# Patient Record
Sex: Male | Born: 1940 | Race: White | Hispanic: No | Marital: Married | Smoking: Never smoker
Health system: Southern US, Community
[De-identification: ages and names within clinical notes are randomized; demographics above are authoritative.]

## PROBLEM LIST (undated history)

## (undated) DIAGNOSIS — M810 Age-related osteoporosis without current pathological fracture: Secondary | ICD-10-CM

## (undated) DIAGNOSIS — H269 Unspecified cataract: Secondary | ICD-10-CM

## (undated) HISTORY — DX: Age-related osteoporosis without current pathological fracture: M81.0

## (undated) HISTORY — PX: BELPHAROPTOSIS REPAIR: SHX369

## (undated) HISTORY — DX: Unspecified cataract: H26.9

## (undated) HISTORY — PX: MANDIBLE SURGERY: SHX707

---
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
## 1998-11-20 HISTORY — PX: HIP FRACTURE SURGERY: SHX118

## 1999-04-09 ENCOUNTER — Encounter: Payer: Self-pay | Admitting: Emergency Medicine

## 1999-04-10 ENCOUNTER — Encounter: Payer: Self-pay | Admitting: Orthopedic Surgery

## 1999-04-10 ENCOUNTER — Inpatient Hospital Stay (HOSPITAL_COMMUNITY): Admission: EM | Admit: 1999-04-10 | Discharge: 1999-04-15 | Payer: Self-pay | Admitting: Emergency Medicine

## 1999-04-15 ENCOUNTER — Inpatient Hospital Stay (HOSPITAL_COMMUNITY)
Admission: RE | Admit: 1999-04-15 | Discharge: 1999-04-22 | Payer: Self-pay | Admitting: Physical Medicine and Rehabilitation

## 2006-06-05 ENCOUNTER — Encounter: Admission: RE | Admit: 2006-06-05 | Discharge: 2006-06-05 | Payer: Self-pay | Admitting: Emergency Medicine

## 2009-11-20 HISTORY — PX: HEMORRHOID SURGERY: SHX153

## 2010-04-14 ENCOUNTER — Ambulatory Visit (HOSPITAL_COMMUNITY): Admission: RE | Admit: 2010-04-14 | Discharge: 2010-04-14 | Payer: Self-pay | Admitting: General Surgery

## 2011-02-06 LAB — CBC
HCT: 46.2 % (ref 39.0–52.0)
MCV: 91.2 fL (ref 78.0–100.0)
Platelets: 194 10*3/uL (ref 150–400)
RBC: 5.06 MIL/uL (ref 4.22–5.81)
WBC: 6.3 10*3/uL (ref 4.0–10.5)

## 2011-02-06 LAB — BASIC METABOLIC PANEL
BUN: 18 mg/dL (ref 6–23)
CO2: 32 mEq/L (ref 19–32)
Calcium: 9.6 mg/dL (ref 8.4–10.5)
Chloride: 104 mEq/L (ref 96–112)
Creatinine, Ser: 1.11 mg/dL (ref 0.4–1.5)
GFR calc Af Amer: >60 mL/min (ref >60)
GFR calc non Af Amer: >60 mL/min (ref >60)
Glucose, Bld: 137 mg/dL — ABNORMAL HIGH (ref 70–99)
Potassium: 5.4 mEq/L — ABNORMAL HIGH (ref 3.5–5.1)
Sodium: 140 mEq/L (ref 135–145)

## 2011-02-06 LAB — DIFFERENTIAL
Eosinophils Absolute: 0 10*3/uL (ref 0.0–0.7)
Eosinophils Relative: 1 % (ref 0–5)
Lymphs Abs: 2.4 10*3/uL (ref 0.7–4.0)
Monocytes Relative: 6 % (ref 3–12)

## 2012-11-07 ENCOUNTER — Other Ambulatory Visit: Payer: Self-pay | Admitting: Family Medicine

## 2012-11-07 ENCOUNTER — Ambulatory Visit
Admission: RE | Admit: 2012-11-07 | Discharge: 2012-11-07 | Disposition: A | Discharge status: Home / Self Care | Payer: BC Managed Care – PPO | Source: Ambulatory Visit | Attending: Family Medicine | Admitting: Family Medicine

## 2012-11-07 DIAGNOSIS — M79602 Pain in left arm: Secondary | ICD-10-CM

## 2014-02-01 IMAGING — CR DG ELBOW COMPLETE 3+V*L*
4 series · 4 of 4 positions shown · non-contrast
Comparison: None

CLINICAL DATA: Fell.  Left elbow pain.

LEFT ELBOW - COMPLETE 3+ VIEW

[view not recorded (1 of 4)]
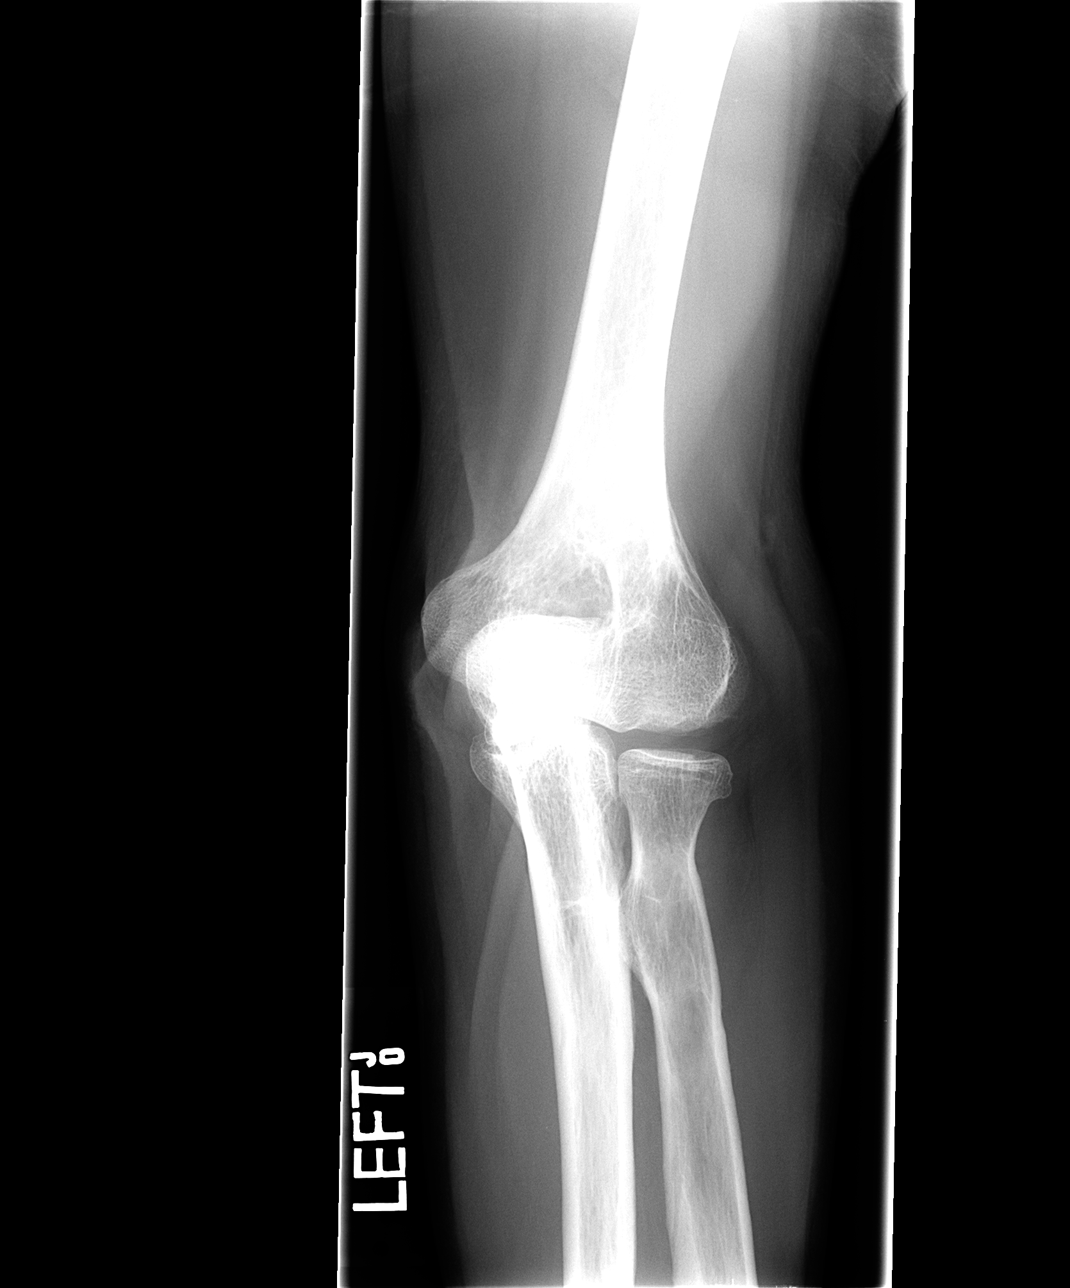

[view not recorded (2 of 4)]
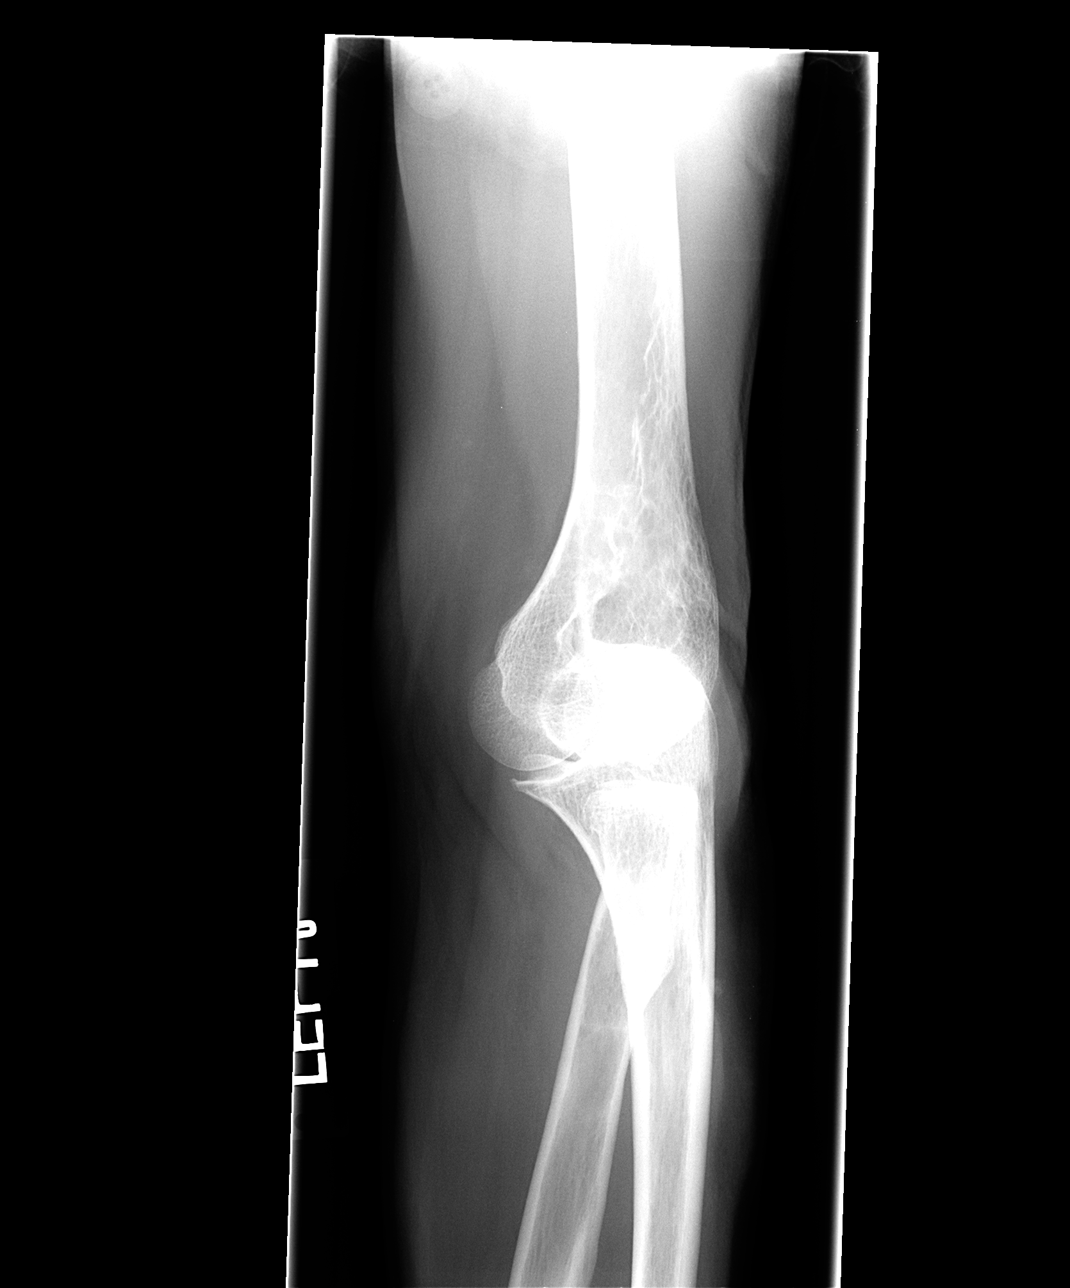

[view not recorded (3 of 4)]
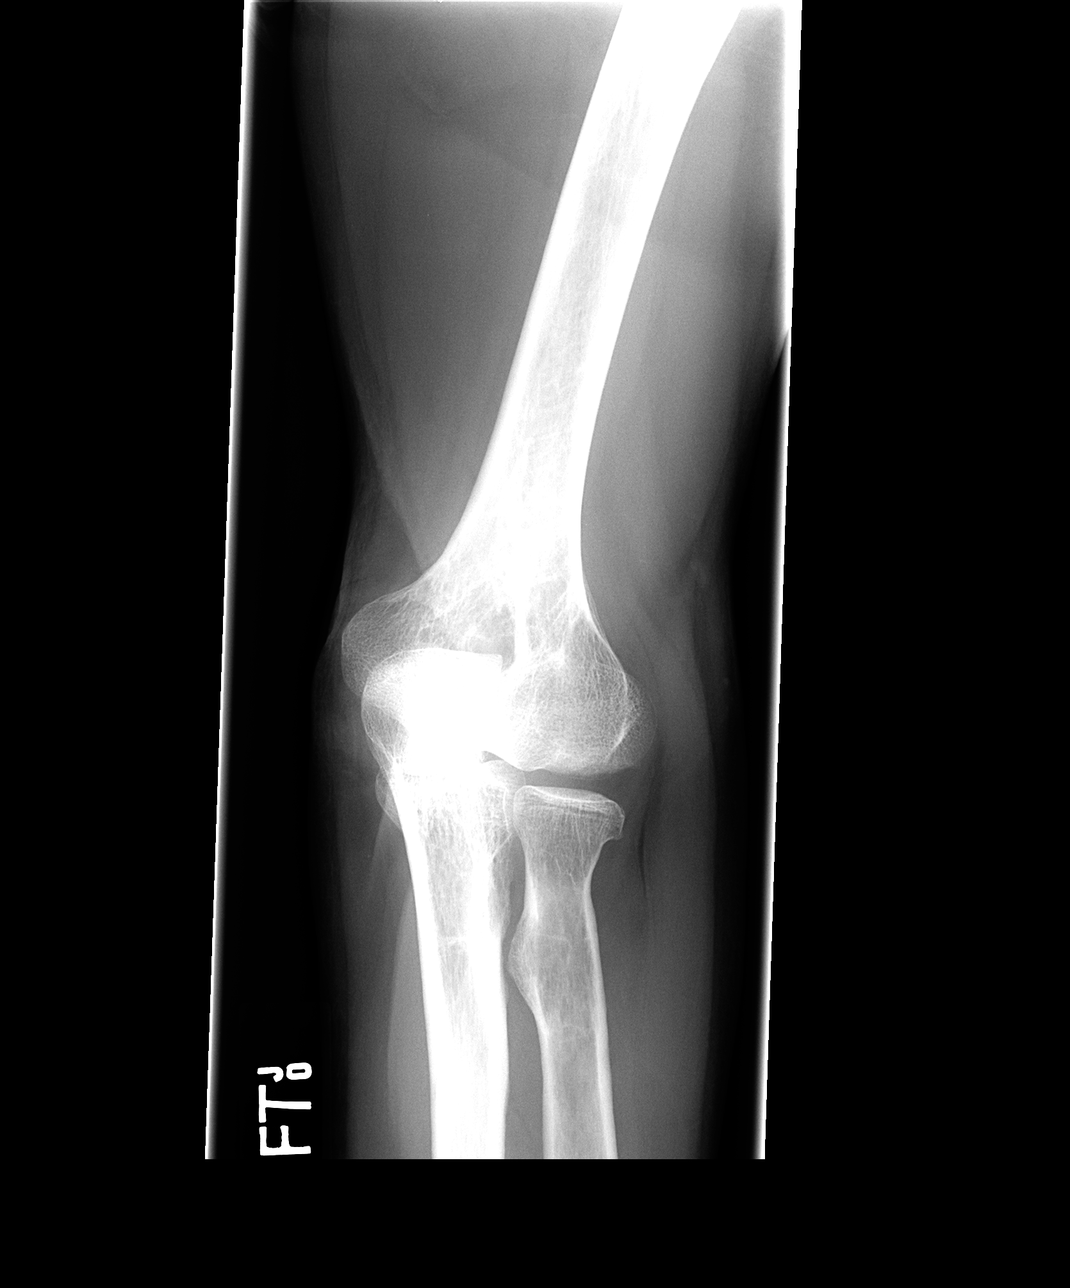

[view not recorded (4 of 4)]
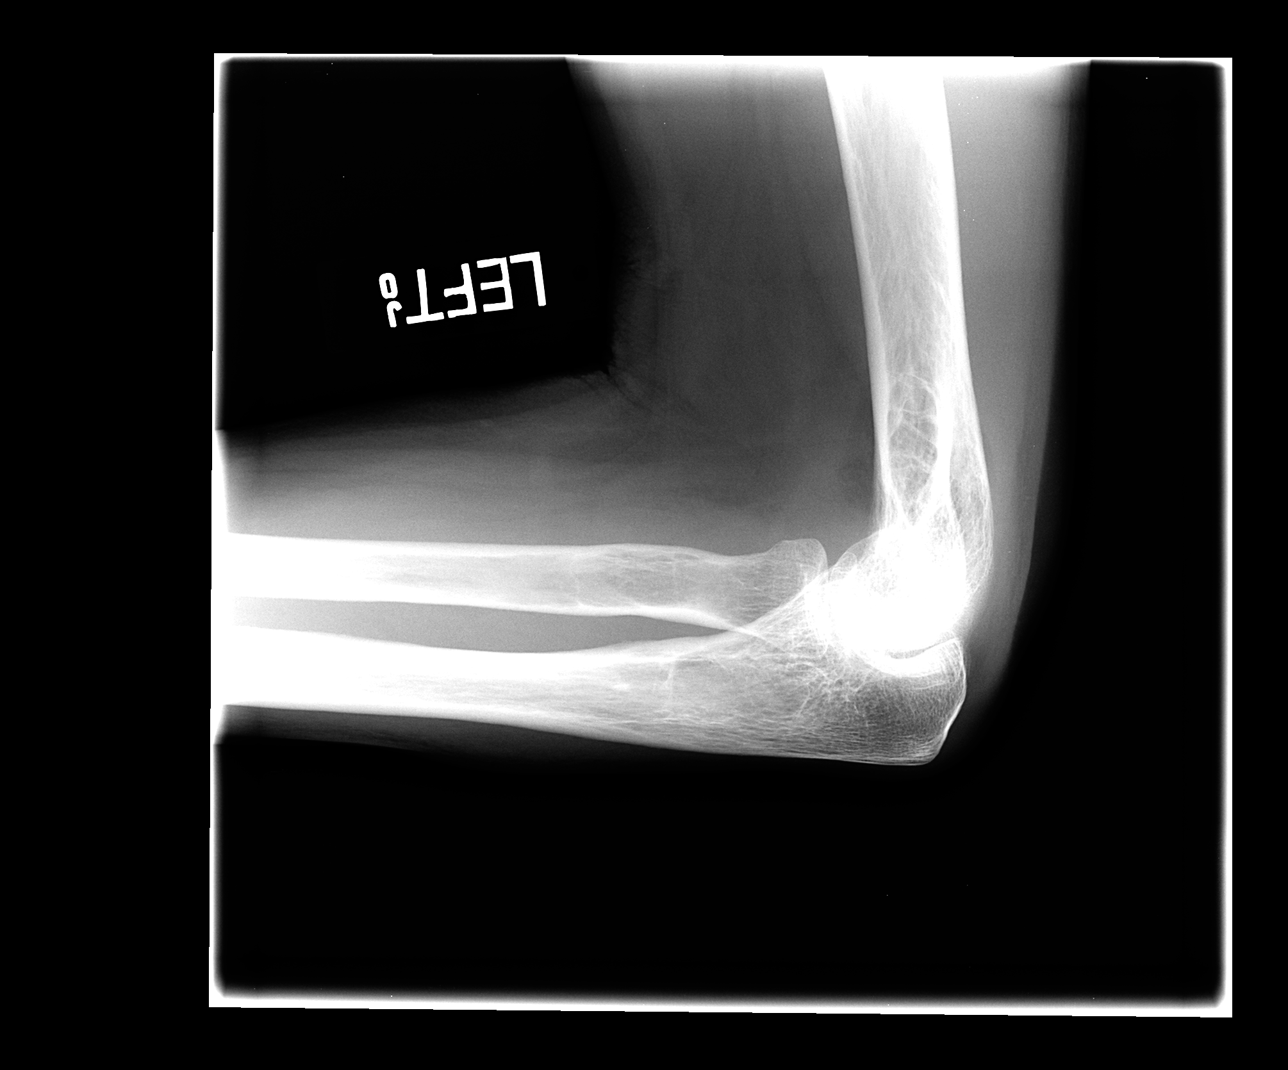

[4 of 4 positions shown; findings below may reference images not displayed]

FINDINGS: There is an elbow joint effusion and a nondisplaced
radial head fracture.
IMPRESSION: Nondisplaced radial head fracture and associated elbow joint
effusion.

## 2014-03-13 ENCOUNTER — Encounter: Payer: Self-pay | Admitting: Internal Medicine

## 2014-07-14 ENCOUNTER — Encounter: Payer: Self-pay | Admitting: Internal Medicine

## 2014-09-02 ENCOUNTER — Ambulatory Visit (AMBULATORY_SURGERY_CENTER): Payer: Medicare PPO | Admitting: *Deleted

## 2014-09-02 VITALS — Ht 74.0 in | Wt 190.0 lb

## 2014-09-02 DIAGNOSIS — Z1211 Encounter for screening for malignant neoplasm of colon: Secondary | ICD-10-CM

## 2014-09-02 MED ORDER — MOVIPREP 100 G PO SOLR: ORAL | Status: DC

## 2014-09-02 NOTE — Progress Notes (Signed)
Patient denies any allergies to eggs or soy. Patient denies any problems with anesthesia/sedation. Patient denies any oxygen use at home and does not take any diet/weight loss medications. EMMI education assisgned to patient on colonoscopy, this was explained and instructions given to patient. 

## 2014-09-14 ENCOUNTER — Telehealth: Payer: Self-pay | Admitting: Internal Medicine

## 2014-09-14 ENCOUNTER — Encounter: Payer: Self-pay | Admitting: Internal Medicine

## 2014-09-14 NOTE — Telephone Encounter (Signed)
Noted. Added to pt's history. Thanks

## 2014-09-16 ENCOUNTER — Ambulatory Visit (AMBULATORY_SURGERY_CENTER): Payer: Medicare PPO | Admitting: Internal Medicine

## 2014-09-16 ENCOUNTER — Encounter: Payer: Self-pay | Admitting: Internal Medicine

## 2014-09-16 VITALS — BP 108/71 | HR 50 | Temp 96.4°F | Resp 17 | Ht 74.0 in | Wt 190.0 lb

## 2014-09-16 DIAGNOSIS — Z1211 Encounter for screening for malignant neoplasm of colon: Secondary | ICD-10-CM

## 2014-09-16 MED ORDER — SODIUM CHLORIDE 0.9 % IV SOLN: 500.0000 mL | INTRAVENOUS | Status: DC

## 2014-09-16 NOTE — Patient Instructions (Signed)
YOU HAD AN ENDOSCOPIC PROCEDURE TODAY AT THE [facility]: Refer to the procedure report that was given to you for any specific questions about what was found during the examination.  If the procedure report does not answer your questions, please call your gastroenterologist to clarify.  If you requested that your care partner not be given the details of your procedure findings, then the procedure report has been included in a sealed envelope for you to review at your convenience later.  YOU SHOULD EXPECT: Some feelings of bloating in the abdomen. Passage of more gas than usual.  Walking can help get rid of the air that was put into your GI tract during the procedure and reduce the bloating. If you had a lower endoscopy (such as a colonoscopy or flexible sigmoidoscopy) you may notice spotting of blood in your stool or on the toilet paper. If you underwent a bowel prep for your procedure, then you may not have a normal bowel movement for a few days.  DIET: Your first meal following the procedure should be a light meal and then it is ok to progress to your normal diet.  A half-sandwich or bowl of soup is an example of a good first meal.  Heavy or fried foods are harder to digest and may make you feel nauseous or bloated.  Likewise meals heavy in dairy and vegetables can cause extra gas to form and this can also increase the bloating.  Drink plenty of fluids but you should avoid alcoholic beverages for 24 hours.  ACTIVITY: Your care partner should take you home directly after the procedure.  You should plan to take it easy, moving slowly for the rest of the day.  You can resume normal activity the day after the procedure however you should NOT DRIVE or use heavy machinery for 24 hours (because of the sedation medicines used during the test).    SYMPTOMS TO REPORT IMMEDIATELY: A gastroenterologist can be reached at any hour.  During normal business hours, 8:30 AM to 5:00 PM Monday through Friday,  call [phone].  After hours and on weekends, please call the GI answering service at [phone] who will take a message and have the physician on call contact you.   Following lower endoscopy (colonoscopy or flexible sigmoidoscopy):  Excessive amounts of blood in the stool  Significant tenderness or worsening of abdominal pains  Swelling of the abdomen that is new, acute  Fever of 100F or higher    FOLLOW UP: If any biopsies were taken you will be contacted by phone or by letter within the next 1-3 weeks.  Call your gastroenterologist if you have not heard about the biopsies in 3 weeks.  Our staff will call the home number listed on your records the next business day following your procedure to check on you and address any questions or concerns that you may have at that time regarding the information given to you following your procedure. This is a courtesy call and so if there is no answer at the home number and we have not heard from you through the emergency physician on call, we will assume that you have returned to your regular daily activities without incident.  SIGNATURES/CONFIDENTIALITY: You and/or your care partner have signed paperwork which will be entered into your electronic medical record.  These signatures attest to the fact that that the information above on your After Visit Summary has been reviewed and is understood.  Full responsibility of the confidentiality   of this discharge information lies with you and/or your care-partner.     

## 2014-09-16 NOTE — Op Note (Signed)
[facility] [address] [place], [id number]   COLONOSCOPY PROCEDURE REPORT  PATIENT: Jose Hunt, [name]  MR#: [id number] BIRTHDATE: Nov 14, 1941 , 73  yrs. old GENDER: male ENDOSCOPIST: Hart Carwinora M Alexsander Cavins, MD REFERRED XB:JYNWGBY:Peter Duaine DredgeBlomgren, M.D. PROCEDURE DATE:  09/16/2014 PROCEDURE:   Colonoscopy, screening First Screening Colonoscopy - Avg.  risk and is 50 yrs.  old or older - No.  Prior Negative Screening - Now for repeat screening. 10 or more years since last screening  History of Adenoma - Now for follow-up colonoscopy & has been > or = to 3 yrs.  N/A  Polyps Removed Today? No.  Polyps Removed Today? No.  Recommend repeat exam, <10 yrs? Polyps Removed Today? No.  Recommend repeat exam, <10 yrs? No. ASA CLASS:   Class II INDICATIONS:screening colonoscopy.  Last exam July 2005 was normal.  MEDICATIONS: Monitored anesthesia care and Propofol 200 mg IV  DESCRIPTION OF PROCEDURE:   After the risks benefits and alternatives of the procedure were thoroughly explained, informed consent was obtained.  The digital rectal exam revealed no abnormalities of the rectum.   The LB PCF Q180 [id number]  endoscope was introduced through the anus and advanced to the cecum, which was identified by both the appendix and ileocecal valve. No adverse events experienced.   The quality of the prep was excellent, using MoviPrep  The instrument was then slowly withdrawn as the colon was fully examined.      COLON FINDINGS: A normal appearing cecum, ileocecal valve, and appendiceal orifice were identified.  The ascending, transverse, descending, sigmoid colon, and rectum appeared unremarkable. Retroflexed views revealed no abnormalities. The time to cecum=5 minutes 02 seconds.  Withdrawal time=7 minutes 52 seconds.  The scope was withdrawn and the procedure completed. COMPLICATIONS: There were no immediate complications.  ENDOSCOPIC IMPRESSION: Normal colonoscopy  RECOMMENDATIONS: High  fiber diet Recall colonoscopy in 10 years  eSigned:  Hart Carwinora M Naszir Cott, MD 09/16/2014 9:26 AM   cc:

## 2014-09-17 ENCOUNTER — Telehealth: Payer: Self-pay

## 2014-09-17 NOTE — Telephone Encounter (Signed)
  Follow up Call-  Call back number 09/16/2014  Post procedure Call Back phone  # [phone]   Permission to leave phone message Yes     Patient questions:  Do you have a fever, pain , or abdominal swelling? No. Pain Score  0 *  Have you tolerated food without any problems? Yes.    Have you been able to return to your normal activities? Yes.    Do you have any questions about your discharge instructions: Diet   No. Medications  No. Follow up visit  No.  Do you have questions or concerns about your Care? No.  Actions: * If pain score is 4 or above: No action needed, pain <4.

## 2020-01-15 ENCOUNTER — Ambulatory Visit: Payer: Medicare PPO | Attending: Internal Medicine

## 2020-01-15 DIAGNOSIS — Z23 Encounter for immunization: Secondary | ICD-10-CM | POA: Insufficient documentation

## 2020-01-15 NOTE — Progress Notes (Signed)
   Covid-19 Vaccination Clinic  Name:  QUENTEN NAWAZ    MRN: [id number] DOB: [birth year]  01/15/2020  Mr. Manera was observed post Covid-19 immunization for 15 minutes without incidence. He was provided with Vaccine Information Sheet and instruction to access the V-Safe system.   Mr. Vanduyne was instructed to call 911 with any severe reactions post vaccine: Marland Kitchen Difficulty breathing  . Swelling of your face and throat  . A fast heartbeat  . A bad rash all over your body  . Dizziness and weakness    Immunizations Administered    Name Date Dose VIS Date Route   Pfizer COVID-19 Vaccine 01/15/2020  2:51 PM 0.3 mL 10/31/2019 Intramuscular   Manufacturer: [employer], [employer]   Lot: [id number]   NDC: [address]-1

## 2020-02-10 ENCOUNTER — Ambulatory Visit: Payer: Medicare PPO | Attending: Internal Medicine

## 2020-02-10 DIAGNOSIS — Z23 Encounter for immunization: Secondary | ICD-10-CM

## 2020-02-10 NOTE — Progress Notes (Signed)
   Covid-19 Vaccination Clinic  Name:  Jose Hunt    MRN: [id number] DOB: April 22, 1941  02/10/2020  [name] was observed post Covid-19 immunization for 15 minutes without incident. He was provided with Vaccine Information Sheet and instruction to access the V-Safe system.   [name] was instructed to call 911 with any severe reactions post vaccine: Marland Kitchen Difficulty breathing  . Swelling of face and throat  . A fast heartbeat  . A bad rash all over body  . Dizziness and weakness   Immunizations Administered    Name Date Dose VIS Date Route   Pfizer COVID-19 Vaccine 02/10/2020  9:42 AM 0.3 mL 10/31/2019 Intramuscular   Manufacturer: [employer], [employer]   Lot: [id number]   NDC: [address]-9

## 2020-09-04 ENCOUNTER — Other Ambulatory Visit: Payer: Self-pay

## 2020-09-04 ENCOUNTER — Ambulatory Visit: Payer: Medicare PPO | Attending: Internal Medicine

## 2020-09-04 DIAGNOSIS — Z23 Encounter for immunization: Secondary | ICD-10-CM

## 2020-09-04 NOTE — Progress Notes (Signed)
   Covid-19 Vaccination Clinic  Name:  Jose Hunt    MRN: [id number] DOB: March 18, 1941  09/04/2020  Mr. Jose Hunt was observed post Covid-19 immunization for 15 minutes without incident. He was provided with Vaccine Information Sheet and instruction to access the V-Safe system.   Mr. Jose Hunt was instructed to call 911 with any severe reactions post vaccine: Marland Kitchen Difficulty breathing  . Swelling of face and throat  . A fast heartbeat  . A bad rash all over body  . Dizziness and weakness
